# Patient Record
Sex: Female | Born: 1975 | Race: White | Hispanic: No | Marital: Married | Smoking: Never smoker
Health system: Southern US, Community
[De-identification: ages and names within clinical notes are randomized; demographics above are authoritative.]

## PROBLEM LIST (undated history)

## (undated) DIAGNOSIS — K5792 Diverticulitis of intestine, part unspecified, without perforation or abscess without bleeding: Secondary | ICD-10-CM

## (undated) DIAGNOSIS — E079 Disorder of thyroid, unspecified: Secondary | ICD-10-CM

## (undated) DIAGNOSIS — K5790 Diverticulosis of intestine, part unspecified, without perforation or abscess without bleeding: Secondary | ICD-10-CM

---

## 2014-12-20 ENCOUNTER — Emergency Department (HOSPITAL_BASED_OUTPATIENT_CLINIC_OR_DEPARTMENT_OTHER)
Admission: EM | Admit: 2014-12-20 | Discharge: 2014-12-20 | Disposition: A | Discharge status: Home / Self Care | Payer: Managed Care, Other (non HMO) | Attending: Emergency Medicine | Admitting: Emergency Medicine

## 2014-12-20 ENCOUNTER — Encounter (HOSPITAL_BASED_OUTPATIENT_CLINIC_OR_DEPARTMENT_OTHER): Payer: Self-pay | Admitting: Emergency Medicine

## 2014-12-20 ENCOUNTER — Emergency Department (HOSPITAL_BASED_OUTPATIENT_CLINIC_OR_DEPARTMENT_OTHER): Payer: Managed Care, Other (non HMO)

## 2014-12-20 DIAGNOSIS — R197 Diarrhea, unspecified: Secondary | ICD-10-CM

## 2014-12-20 DIAGNOSIS — K529 Noninfective gastroenteritis and colitis, unspecified: Secondary | ICD-10-CM

## 2014-12-20 DIAGNOSIS — Z79899 Other long term (current) drug therapy: Secondary | ICD-10-CM | POA: Insufficient documentation

## 2014-12-20 DIAGNOSIS — E079 Disorder of thyroid, unspecified: Secondary | ICD-10-CM | POA: Diagnosis not present

## 2014-12-20 DIAGNOSIS — R1084 Generalized abdominal pain: Secondary | ICD-10-CM

## 2014-12-20 HISTORY — DX: Diverticulosis of intestine, part unspecified, without perforation or abscess without bleeding: K57.90

## 2014-12-20 HISTORY — DX: Diverticulitis of intestine, part unspecified, without perforation or abscess without bleeding: K57.92

## 2014-12-20 HISTORY — DX: Disorder of thyroid, unspecified: E07.9

## 2014-12-20 LAB — URINALYSIS, ROUTINE W REFLEX MICROSCOPIC
BILIRUBIN URINE: NEGATIVE
GLUCOSE, UA: NEGATIVE mg/dL
KETONES UR: NEGATIVE mg/dL
LEUKOCYTES UA: NEGATIVE
Nitrite: NEGATIVE
Protein, ur: NEGATIVE mg/dL
Specific Gravity, Urine: 1.026 (ref 1.005–1.030)
Urobilinogen, UA: 0.2 mg/dL (ref 0.0–1.0)
pH: 5.5 (ref 5.0–8.0)

## 2014-12-20 LAB — COMPREHENSIVE METABOLIC PANEL
ALT: 17 U/L (ref 0–35)
AST: 21 U/L (ref 0–37)
Albumin: 4.2 g/dL (ref 3.5–5.2)
Alkaline Phosphatase: 50 U/L (ref 39–117)
Anion gap: 5 (ref 5–15)
BUN: 19 mg/dL (ref 6–23)
CALCIUM: 8.4 mg/dL (ref 8.4–10.5)
CHLORIDE: 107 mmol/L (ref 96–112)
CO2: 20 mmol/L (ref 19–32)
CREATININE: 0.61 mg/dL (ref 0.50–1.10)
GFR calc non Af Amer: >90 mL/min (ref >90)
GLUCOSE: 128 mg/dL — AB (ref 70–99)
Potassium: 3.8 mmol/L (ref 3.5–5.1)
Sodium: 132 mmol/L — ABNORMAL LOW (ref 135–145)
Total Bilirubin: 0.7 mg/dL (ref 0.3–1.2)
Total Protein: 7.8 g/dL (ref 6.0–8.3)

## 2014-12-20 LAB — URINE MICROSCOPIC-ADD ON

## 2014-12-20 LAB — CBC WITH DIFFERENTIAL/PLATELET
BASOS PCT: 0 % (ref 0–1)
Basophils Absolute: 0 10*3/uL (ref 0.0–0.1)
EOS PCT: 0 % (ref 0–5)
Eosinophils Absolute: 0 10*3/uL (ref 0.0–0.7)
HEMATOCRIT: 40.2 % (ref 36.0–46.0)
Hemoglobin: 13.6 g/dL (ref 12.0–15.0)
Lymphocytes Relative: 5 % — ABNORMAL LOW (ref 12–46)
Lymphs Abs: 0.4 10*3/uL — ABNORMAL LOW (ref 0.7–4.0)
MCH: 30.3 pg (ref 26.0–34.0)
MCHC: 33.8 g/dL (ref 30.0–36.0)
MCV: 89.5 fL (ref 78.0–100.0)
Monocytes Absolute: 0.3 10*3/uL (ref 0.1–1.0)
Monocytes Relative: 3 % (ref 3–12)
Neutro Abs: 8.2 10*3/uL — ABNORMAL HIGH (ref 1.7–7.7)
Neutrophils Relative %: 92 % — ABNORMAL HIGH (ref 43–77)
Platelets: 215 10*3/uL (ref 150–400)
RBC: 4.49 MIL/uL (ref 3.87–5.11)
RDW: 13.5 % (ref 11.5–15.5)
WBC: 9 10*3/uL (ref 4.0–10.5)

## 2014-12-20 LAB — CLOSTRIDIUM DIFFICILE BY PCR: Toxigenic C. Difficile by PCR: NEGATIVE

## 2014-12-20 MED ORDER — ONDANSETRON HCL 4 MG/2ML IJ SOLN
4.0000 mg | Freq: Once | INTRAMUSCULAR | Status: AC
  Administered 2014-12-20: 4 mg via INTRAVENOUS

## 2014-12-20 MED ORDER — ONDANSETRON HCL 4 MG/2ML IJ SOLN
INTRAMUSCULAR | Status: AC
  Filled 2014-12-20: qty 2

## 2014-12-20 MED ORDER — CIPROFLOXACIN HCL 500 MG PO TABS: 500.0000 mg | ORAL_TABLET | Freq: Two times a day (BID) | ORAL | Status: AC

## 2014-12-20 MED ORDER — TRAMADOL HCL 50 MG PO TABS: 50.0000 mg | ORAL_TABLET | Freq: Four times a day (QID) | ORAL | Status: AC | PRN

## 2014-12-20 MED ORDER — VANCOMYCIN HCL 125 MG PO CAPS: 125.0000 mg | ORAL_CAPSULE | Freq: Four times a day (QID) | ORAL | Status: AC

## 2014-12-20 MED ORDER — SODIUM CHLORIDE 0.9 % IV SOLN
1000.0000 mL | Freq: Once | INTRAVENOUS | Status: AC
  Administered 2014-12-20: 1000 mL via INTRAVENOUS

## 2014-12-20 MED ORDER — LACTATED RINGERS IV BOLUS (SEPSIS)
1000.0000 mL | Freq: Once | INTRAVENOUS | Status: AC
  Administered 2014-12-20: 1000 mL via INTRAVENOUS

## 2014-12-20 NOTE — ED Notes (Signed)
Pt planning to fly home earliest possible and f/u with pcp, no diarrhea at this time, denies abd pain, vs stable

## 2014-12-20 NOTE — Discharge Instructions (Signed)
We suspect that you have a cdiff infection causing your symptoms. Please start the antibiotic, speak with your GI doctor on further direction. If you are unable to get in touch with the Gi doctor, it would be best to finish the course of antibiotics.  Clear Liquid Diet A clear liquid diet is a short-term diet that is prescribed to provide the necessary fluid and basic energy you need when you can have nothing else. The clear liquid diet consists of liquids or solids that will become liquid at room temperature. You should be able to see through the liquid. There are many reasons that you may be restricted to clear liquids, such as: When you have a sudden-onset (acute) condition that occurs before or after surgery. To help your body slowly get adjusted to food again after a long period when you were unable to have food. Replacement of fluids when you have a diarrheal disease. When you are going to have certain exams, such as a colonoscopy, in which instruments are inserted inside your body to look at parts of your digestive system. WHAT CAN I HAVE? A clear liquid diet does not provide all the nutrients you need. It is important to choose a variety of the following items to get as many nutrients as possible: Vegetable juices that do not have pulp. Fruit juices and fruit drinks that do not have pulp. Coffee (regular or decaffeinated), tea, or soda at the discretion of your health care provider. Clear bouillon, broth, or strained broth-based soups. High-protein and flavored gelatins. Sugar or honey. Ices or frozen ice pops that do not contain milk. If you are not sure whether you can have certain items, you should ask your health care provider. You may also ask your health care provider if there are any other clear liquid options. Document Released: 10/11/2005 Document Revised: 10/16/2013 Document Reviewed: 09/07/2013 [facility] Patient Information 2015 [place], [place]. This information is not intended  to replace advice given to you by your health care provider. Make sure you discuss any questions you have with your health care provider. Clostridium Difficile Infection Clostridium difficile (C. difficile) is a bacteria found in the intestinal tract or colon. Under certain conditions, it causes diarrhea and sometimes severe disease. The severe form of the disease is known as pseudomembranous colitis (often called C. difficile colitis). This disease can damage the lining of the colon or cause the colon to become enlarged (toxic megacolon). CAUSES Your colon normally contains many different bacteria, including C. difficile. The balance of bacteria in your colon can change during illness. This is especially true when you take antibiotic medicine. Taking antibiotics may allow the C. difficile to grow, multiply excessively, and make a toxin that then causes illness. The elderly and people with certain medical conditions have a greater risk of getting C. difficile infections. SYMPTOMS  Watery diarrhea.  Fever.  Fatigue.  Loss of appetite.  Nausea.  Abdominal swelling, pain, or tenderness.  Dehydration. DIAGNOSIS Your symptoms may make your caregiver suspect a C. difficile infection, especially if you have used antibiotics in the preceding weeks. However, there are only 2 ways to know for certain whether you have a C. difficile infection:  A lab test that finds the toxin in your stool.  The specific appearance of an abnormality (pseudomembrane) in your colon. This can only be seen by doing a sigmoidoscopy or colonoscopy. These procedures involve passing an instrument through your rectum to look at the inside of your colon. Your caregiver will help determine if  these tests are necessary. TREATMENT  Most people are successfully treated with one of two specific antibiotics, usually given by mouth. Other antibiotics you are receiving are stopped if possible.  Intravenous (IV) fluids and  correction of electrolyte imbalance may be necessary.  Rarely, surgery may be needed to remove the infected part of the intestines.  Careful hand washing by you and your caregivers is important to prevent the spread of infection. In the hospital, your caregivers may also put on gowns and gloves to prevent the spread of the C. difficile bacteria. Your room is also cleaned regularly with a solution containing bleach or a product that is known to kill C. difficile. HOME CARE INSTRUCTIONS  Drink enough fluids to keep your urine clear or pale yellow. Avoid milk, caffeine, and alcohol.  Ask your caregiver for specific rehydration instructions.  Try eating small, frequent meals rather than large meals.  Take your antibiotics as directed. Finish them even if you start to feel better.  Do not use medicines to slow diarrhea. This could delay healing or cause complications.  Wash your hands thoroughly after using the bathroom and before preparing food.  Make sure people who live with you wash their hands often, too.  Carefully disinfect all surfaces with a product that contains chlorine bleach. SEEK MEDICAL CARE IF:  Diarrhea persists longer than expected or recurs after completing your course of antibiotic treatment for the C. difficile infection.  You have trouble staying hydrated. SEEK IMMEDIATE MEDICAL CARE IF:  You develop a new fever.  You have increasing abdominal pain or tenderness.  There is blood in your stools, or your stools are dark black and tarry.  You cannot hold down food or liquids. MAKE SURE YOU:  Understand these instructions.  Will watch your condition.  Will get help right away if you are not doing well or get worse. Document Released: 07/21/2005 Document Revised: 02/25/2014 Document Reviewed: 03/19/2011 [facility] Patient Information 2015 [place], [place]. This information is not intended to replace advice given to you by your health care provider. Make sure you  discuss any questions you have with your health care provider.

## 2014-12-20 NOTE — ED Notes (Signed)
bsc set up at patient's bedside, informed to notify RN if she is able to obtain a stool sample

## 2014-12-20 NOTE — ED Notes (Signed)
Pt states that she has h/o diverticulitis, was in hospital in dec. Recently completed 10 day course of flagyl for known cdiff, tonight developed severe cramping and diarrhea,

## 2014-12-20 NOTE — ED Provider Notes (Addendum)
CSN: [id number]     Arrival date & time 12/20/14  0138 History   First MD Initiated Contact with Patient 12/20/14 [phone]     Chief Complaint  Patient presents with  . 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 [place] is from [place], and here on a business trip. She reports that she started feeling unwell early part of the evening, and subsequently developed a diarrhea and diffuse abdominal pain. Pt just completed a course of flagyl for cdiff. She reports that the stools were watery and foul smelling, but not as bad as with cdiff. She has no fevers, but is having chills. No bloody stools. No sick contacts, no suspicious po intake.    ROS 10 Systems reviewed and are negative for acute change except as noted in the HPI.     Patient is a 39 y.o. female presenting with diarrhea. The history is provided by the patient.  Diarrhea Associated symptoms: abdominal pain   Associated symptoms: no headaches and no vomiting     Past Medical History  Diagnosis Date  . Diverticulitis   . Thyroid disease   . Diverticular disease    History reviewed. No pertinent past surgical history. History reviewed. No pertinent family history. History  Substance Use Topics  . Smoking status: Never Smoker   . Smokeless tobacco: Not on file  . Alcohol Use: No   OB History    No data available     Review of Systems  Constitutional: Positive for activity change.  Respiratory: Negative for shortness of breath.   Cardiovascular: Negative for chest pain.  Gastrointestinal: Positive for abdominal pain and diarrhea. Negative for vomiting.  Genitourinary: Negative for dysuria.  Musculoskeletal: Negative for neck pain.  Neurological: Negative for dizziness and headaches.      Allergies  Augmentin and Flagyl  Home Medications   Prior to Admission medications    Medication Sig Start Date End Date Taking? Authorizing Provider  Calcium Carbonate-Vitamin D (CALCIUM-VITAMIN D) 500-200 MG-UNIT per tablet Take 1 tablet by mouth daily.   Yes Historical Provider, MD  ciprofloxacin (CIPRO) 500 MG tablet Take 1 tablet (500 mg total) by mouth 2 (two) times daily. 12/20/14   Derwood KaplanAnkit Soriyah Osberg, MD  ferrous fumarate (HEMOCYTE - 106 MG FE) 325 (106 FE) MG TABS tablet Take 1 tablet by mouth.   Yes Historical Provider, MD  Levothyroxine Sodium 100 MCG CAPS Take by mouth daily before breakfast.   Yes Historical Provider, MD  vancomycin (VANCOCIN HCL) 125 MG capsule Take 1 capsule (125 mg total) by mouth 4 (four) times daily. 12/20/14   Roshaun Pound Rhunette CroftNanavati, MD   BP 140/64 mmHg  Pulse 98  Temp(Src) 98.4 F (36.9 C) (Oral)  Resp 18  Ht 5\' 6"  (1.676 m)  Wt 148 lb (67.132 kg)  BMI 23.90 kg/m2  SpO2 100%  LMP 12/06/2014 Physical Exam  Constitutional: She is oriented to person, place, and time. She appears well-developed and well-nourished.  HENT:  Head: Normocephalic and atraumatic.  Eyes: EOM are normal. Pupils are equal, round, and reactive to light.  Neck: Neck supple.  Cardiovascular: Normal rate, regular rhythm and normal heart sounds.   No murmur heard. Pulmonary/Chest: Effort normal. No respiratory distress.  Abdominal: Soft. She exhibits no distension. There is tenderness. There is no rebound and no guarding.  Diffuse tenderness, hyperactive bowel sounds  Neurological: She is alert and oriented to person, place, and time.  Skin: Skin is warm and dry.  Nursing note and vitals reviewed.   ED Course  Procedures (including critical care time) Labs Review Labs Reviewed  URINALYSIS, ROUTINE W REFLEX MICROSCOPIC - Abnormal; Notable for the following:    Hgb urine dipstick LARGE (*)    All other components within normal limits  CBC WITH DIFFERENTIAL/PLATELET - Abnormal; Notable for the following:    Neutrophils Relative % 92 (*)    Neutro Abs 8.2 (*)     Lymphocytes Relative 5 (*)    Lymphs Abs 0.4 (*)    All other components within normal limits  COMPREHENSIVE METABOLIC PANEL - Abnormal; Notable for the following:    Sodium 132 (*)    Glucose, Bld 128 (*)    All other components within normal limits  URINE MICROSCOPIC-ADD ON - Abnormal; Notable for the following:    Bacteria, UA FEW (*)    All other components within normal limits  CLOSTRIDIUM DIFFICILE BY PCR    Imaging Review Dg Abd 2 Views  12/20/2014   CLINICAL DATA:  Diarrhea.  EXAM: ABDOMEN - 2 VIEW  COMPARISON:  None.  FINDINGS: There is increased air throughout normal caliber small and large bowel loops in a pattern consistent with enteritis. No disproportionate bowel dilatation to suggest obstruction. There is no free intra-abdominal air. Air fluid level in the left upper quadrant of the abdomen is likely within the stomach. No evidence of radiopaque calculi. The included lung bases are clear. There are no osseous abnormalities.  IMPRESSION: Increased air throughout normal caliber small and large bowel loops in a pattern consistent with enteritis. No free air. No evidence of obstruction.   Electronically Signed   By: Rubye Oaks M.D.   On: 12/20/2014 05:47     EKG Interpretation None     :00 am - lab findings, xray findings discussed. Pt is getting her 2nd iv fluid bag. Her BM have stopped since coming to the ER, so we dont have a stool sample. Plan is to give patient vanc for cdiff, cipro for diverticulitis. She is allergic to flagyl. She will call her pcp in the AM and decide on the antibiotics. I personally think this is likely cdiff colitis. Viral gastro is also possible.   -advised to start oral vanc this AM MDM   Final diagnoses:  Enteritis  Abdominal pain, generalized   Pt comes in with cc of diarrhea and abd pain. Hx of cdiff colitis.  Based on hx, concerns for cdiff colitis, gastroenteritis. AAS ordered, as there is diffuse tenderness on exam - and to  r/o free air that can cause similar sx from the perforated diverticulitis. Xray results as above. BM have stopped now - so cdiff sample is still pending. Pt is from [place]. Plan is to give her antibiotic rx and she will vall her PCP in the AM and decide if she wants to start the antibiotics or wait until their evaluation. With no evidence of toxic mega colon, or perforated BM and 0 SIRS - pt does have the benefit of waiting until speaking with her doctor in a few hours. She is to fly out later today to her home.    Derwood Kaplan, MD 12/20/14 1610  Derwood Kaplan, MD 12/20/14 [phone]

## 2016-08-15 IMAGING — DX DG ABDOMEN 2V
2 series · 2 of 2 positions shown · non-contrast
Comparison: None.

CLINICAL DATA: Diarrhea.

EXAM:
ABDOMEN - 2 VIEW

[abdomen supine]
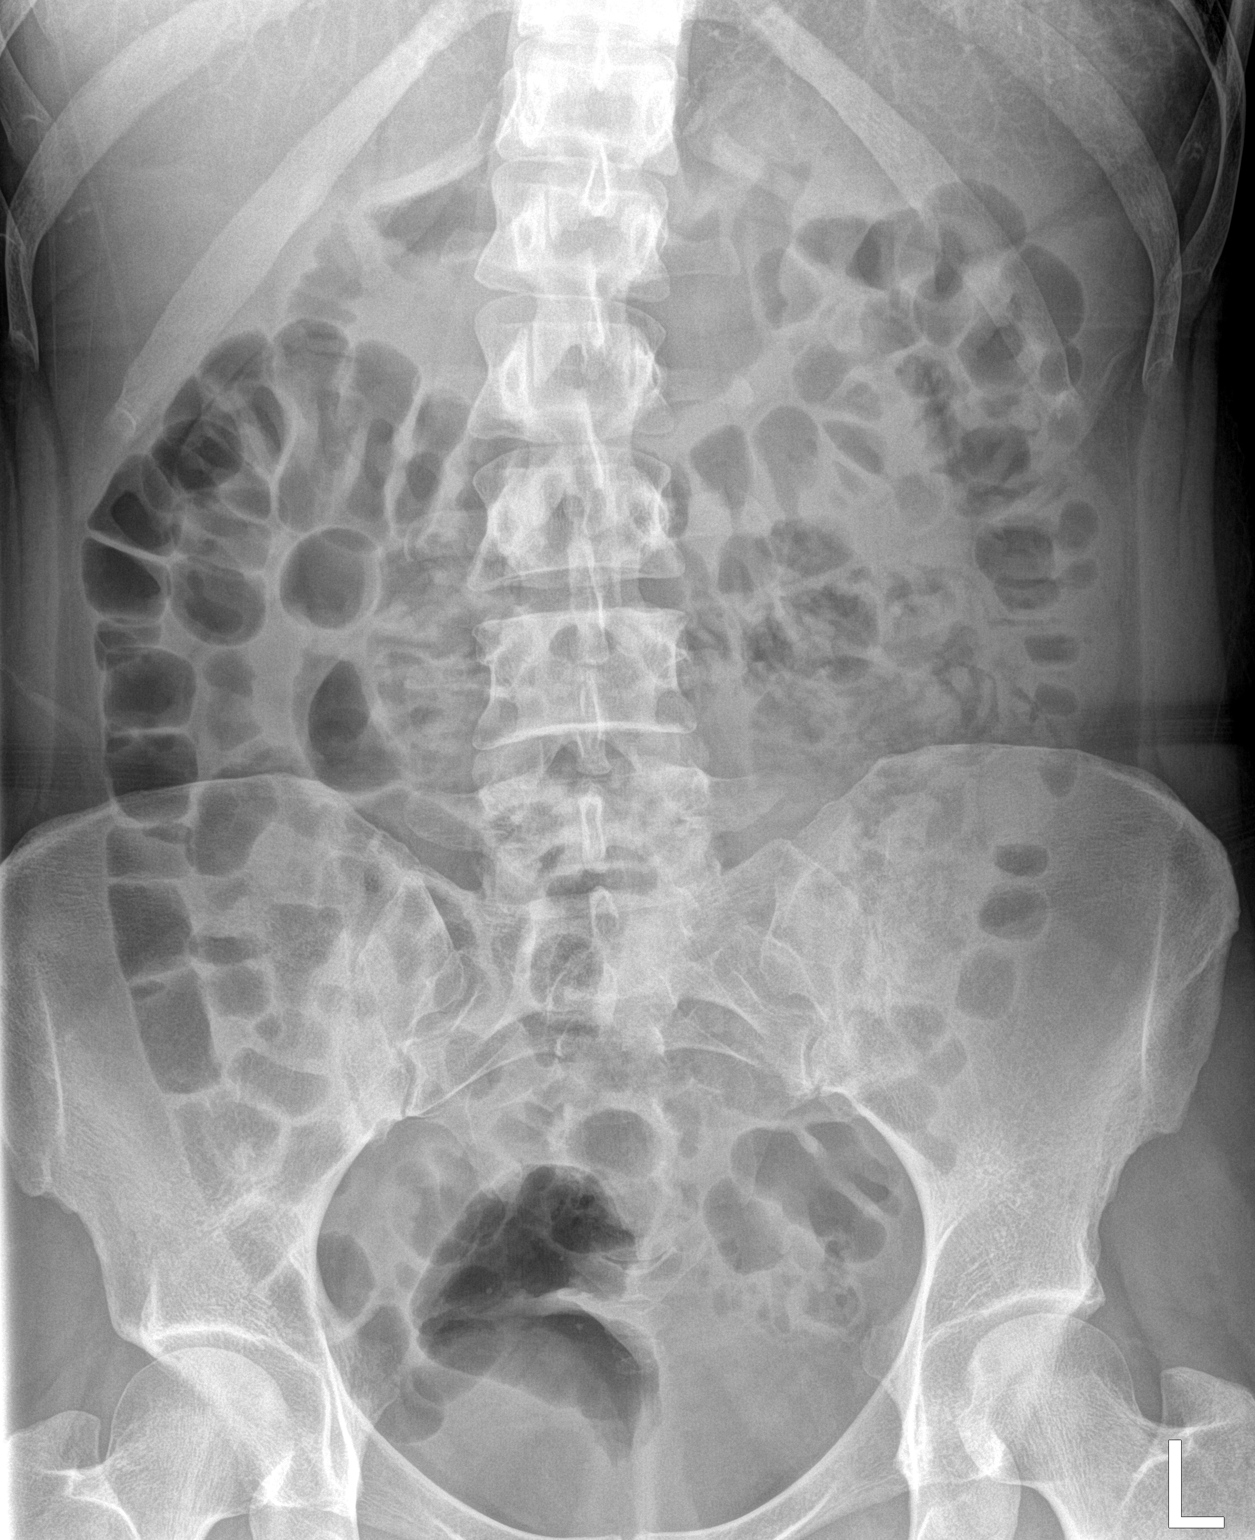

[abdomen erect]
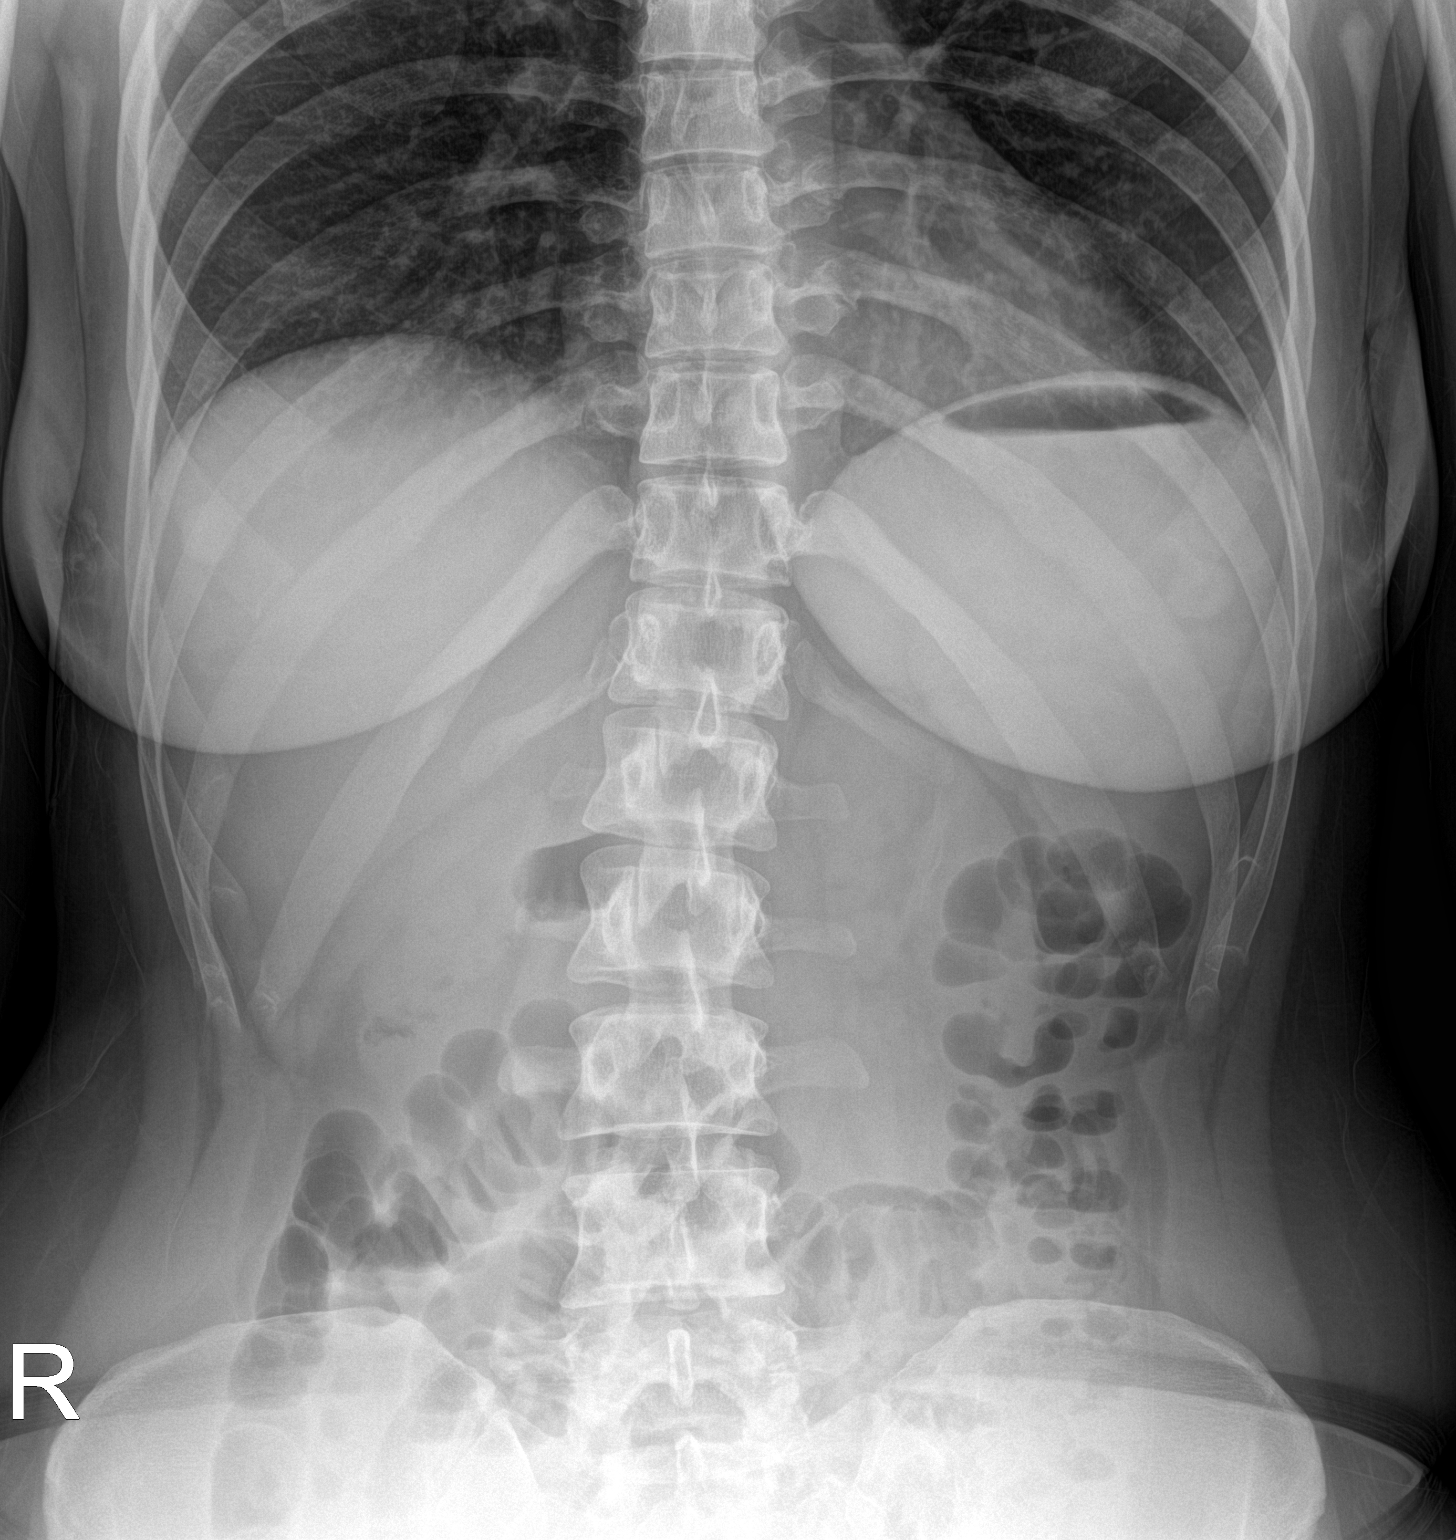

[2 of 2 positions shown; findings below may reference images not displayed]

FINDINGS: There is increased air throughout normal caliber small and large
bowel loops in a pattern consistent with enteritis. No
disproportionate bowel dilatation to suggest obstruction. There is
no free intra-abdominal air. Air fluid level in the left upper
quadrant of the abdomen is likely within the stomach. No evidence of
radiopaque calculi. The included lung bases are clear. There are no
osseous abnormalities.
IMPRESSION: Increased air throughout normal caliber small and large bowel loops
in a pattern consistent with enteritis. No free air. No evidence of
obstruction.
